# Patient Record
Sex: Male | Born: 1955
Health system: Southern US, Community
[De-identification: ages and names within clinical notes are randomized; demographics above are authoritative.]

## PROBLEM LIST (undated history)

## (undated) DIAGNOSIS — E785 Hyperlipidemia, unspecified: Secondary | ICD-10-CM

## (undated) DIAGNOSIS — J302 Other seasonal allergic rhinitis: Secondary | ICD-10-CM

## (undated) DIAGNOSIS — T7840XA Allergy, unspecified, initial encounter: Secondary | ICD-10-CM

## (undated) DIAGNOSIS — I10 Essential (primary) hypertension: Secondary | ICD-10-CM

## (undated) HISTORY — PX: BACK SURGERY: SHX140

## (undated) HISTORY — DX: Essential (primary) hypertension: I10

## (undated) HISTORY — DX: Hyperlipidemia, unspecified: E78.5

## (undated) HISTORY — DX: Other seasonal allergic rhinitis: J30.2

## (undated) HISTORY — DX: Allergy, unspecified, initial encounter: T78.40XA

---

## 2003-09-18 ENCOUNTER — Encounter: Payer: Self-pay | Admitting: Internal Medicine

## 2005-02-16 HISTORY — PX: POLYPECTOMY: SHX149

## 2005-02-16 HISTORY — PX: COLONOSCOPY: SHX174

## 2005-04-21 ENCOUNTER — Ambulatory Visit: Payer: Self-pay | Admitting: Internal Medicine

## 2005-04-22 ENCOUNTER — Encounter: Admission: RE | Admit: 2005-04-22 | Discharge: 2005-04-22 | Payer: Self-pay | Admitting: Internal Medicine

## 2005-05-11 ENCOUNTER — Ambulatory Visit: Payer: Self-pay | Admitting: Internal Medicine

## 2005-05-20 ENCOUNTER — Ambulatory Visit: Payer: Self-pay | Admitting: Internal Medicine

## 2005-06-17 ENCOUNTER — Ambulatory Visit: Payer: Self-pay | Admitting: Gastroenterology

## 2005-07-06 ENCOUNTER — Ambulatory Visit: Payer: Self-pay | Admitting: Gastroenterology

## 2005-07-06 ENCOUNTER — Encounter (INDEPENDENT_AMBULATORY_CARE_PROVIDER_SITE_OTHER): Payer: Self-pay | Admitting: *Deleted

## 2005-07-06 LAB — HM COLONOSCOPY

## 2005-08-17 ENCOUNTER — Ambulatory Visit: Payer: Self-pay | Admitting: Internal Medicine

## 2005-08-31 ENCOUNTER — Ambulatory Visit: Payer: Self-pay | Admitting: Internal Medicine

## 2005-10-28 ENCOUNTER — Ambulatory Visit: Payer: Self-pay | Admitting: Internal Medicine

## 2006-03-11 ENCOUNTER — Ambulatory Visit: Payer: Self-pay | Admitting: Internal Medicine

## 2006-04-19 ENCOUNTER — Ambulatory Visit: Payer: Self-pay | Admitting: Internal Medicine

## 2006-04-19 LAB — CONVERTED CEMR LAB
AST: 22 units/L (ref 0–37)
Basophils Relative: 1.2 % — ABNORMAL HIGH (ref 0.0–1.0)
Bilirubin, Direct: 0.1 mg/dL (ref 0.0–0.3)
CO2: 30 meq/L (ref 19–32)
Calcium: 9.4 mg/dL (ref 8.4–10.5)
Cholesterol: 207 mg/dL (ref 0–200)
Direct LDL: 131.4 mg/dL
Eosinophils Absolute: 0.4 10*3/uL (ref 0.0–0.6)
Eosinophils Relative: 6.3 % — ABNORMAL HIGH (ref 0.0–5.0)
GFR calc Af Amer: 63 mL/min
HCT: 42.2 % (ref 39.0–52.0)
HDL: 46.2 mg/dL (ref >39.0)
MCHC: 35.2 g/dL (ref 30.0–36.0)
Monocytes Relative: 7.3 % (ref 3.0–11.0)
PSA: 0.59 ng/mL (ref 0.10–4.00)
RBC: 4.82 M/uL (ref 4.22–5.81)
Sodium: 144 meq/L (ref 135–145)
Total Bilirubin: 1 mg/dL (ref 0.3–1.2)
Total CHOL/HDL Ratio: 4.5
VLDL: 31 mg/dL (ref 0–40)

## 2006-04-26 ENCOUNTER — Ambulatory Visit: Payer: Self-pay | Admitting: Internal Medicine

## 2006-05-17 ENCOUNTER — Ambulatory Visit: Payer: Self-pay | Admitting: Internal Medicine

## 2006-05-25 ENCOUNTER — Encounter: Admission: RE | Admit: 2006-05-25 | Discharge: 2006-05-25 | Payer: Self-pay | Admitting: Anesthesiology

## 2006-05-31 ENCOUNTER — Ambulatory Visit (HOSPITAL_COMMUNITY): Admission: RE | Admit: 2006-05-31 | Discharge: 2006-06-01 | Payer: Self-pay | Admitting: Neurological Surgery

## 2007-11-28 ENCOUNTER — Ambulatory Visit: Payer: Self-pay | Admitting: Internal Medicine

## 2007-11-28 ENCOUNTER — Telehealth: Payer: Self-pay | Admitting: Internal Medicine

## 2007-12-13 ENCOUNTER — Ambulatory Visit: Payer: Self-pay | Admitting: Internal Medicine

## 2007-12-13 LAB — CONVERTED CEMR LAB
AST: 27 units/L (ref 0–37)
Albumin: 4 g/dL (ref 3.5–5.2)
Alkaline Phosphatase: 42 units/L (ref 39–117)
Basophils Absolute: 0 10*3/uL (ref 0.0–0.1)
Basophils Relative: 0.6 % (ref 0.0–3.0)
Bilirubin Urine: NEGATIVE
Calcium: 9.3 mg/dL (ref 8.4–10.5)
Chloride: 106 meq/L (ref 96–112)
Cholesterol: 172 mg/dL (ref 0–200)
Eosinophils Absolute: 0.2 10*3/uL (ref 0.0–0.7)
Eosinophils Relative: 3.7 % (ref 0.0–5.0)
GFR calc non Af Amer: 75 mL/min
Glucose, Bld: 92 mg/dL (ref 70–99)
HDL: 41.5 mg/dL (ref >39.0)
LDL Cholesterol: 110 mg/dL — ABNORMAL HIGH (ref 0–99)
Lymphocytes Relative: 33.8 % (ref 12.0–46.0)
Potassium: 5 meq/L (ref 3.5–5.1)
Protein, U semiquant: NEGATIVE
Sodium: 144 meq/L (ref 135–145)
Specific Gravity, Urine: 1.015
TSH: 0.9 microintl units/mL (ref 0.35–5.50)
Triglycerides: 102 mg/dL (ref 0–149)

## 2007-12-20 ENCOUNTER — Ambulatory Visit: Payer: Self-pay | Admitting: Internal Medicine

## 2007-12-20 DIAGNOSIS — E785 Hyperlipidemia, unspecified: Secondary | ICD-10-CM

## 2008-09-25 ENCOUNTER — Telehealth (INDEPENDENT_AMBULATORY_CARE_PROVIDER_SITE_OTHER): Payer: Self-pay | Admitting: *Deleted

## 2009-06-12 ENCOUNTER — Ambulatory Visit: Payer: Self-pay | Admitting: Internal Medicine

## 2009-06-12 LAB — CONVERTED CEMR LAB
ALT: 49 units/L (ref 0–53)
Albumin: 4.3 g/dL (ref 3.5–5.2)
Alkaline Phosphatase: 39 units/L (ref 39–117)
BUN: 17 mg/dL (ref 6–23)
Bilirubin Urine: NEGATIVE
Bilirubin, Direct: 0.1 mg/dL (ref 0.0–0.3)
CO2: 30 meq/L (ref 19–32)
Cholesterol: 187 mg/dL (ref 0–200)
Eosinophils Absolute: 0.3 10*3/uL (ref 0.0–0.7)
GFR calc non Af Amer: 82.69 mL/min (ref >60)
Glucose, Bld: 97 mg/dL (ref 70–99)
HCT: 42 % (ref 39.0–52.0)
HDL: 52.2 mg/dL (ref >39.00)
Hemoglobin: 14.8 g/dL (ref 13.0–17.0)
MCHC: 35.3 g/dL (ref 30.0–36.0)
MCV: 89.4 fL (ref 78.0–100.0)
Monocytes Absolute: 0.3 10*3/uL (ref 0.1–1.0)
Monocytes Relative: 5.5 % (ref 3.0–12.0)
Potassium: 4.5 meq/L (ref 3.5–5.1)
RBC: 4.69 M/uL (ref 4.22–5.81)
RDW: 13.1 % (ref 11.5–14.6)
Total CHOL/HDL Ratio: 4
Total Protein: 6.9 g/dL (ref 6.0–8.3)
Urobilinogen, UA: 0.2
VLDL: 25.8 mg/dL (ref 0.0–40.0)

## 2009-06-21 ENCOUNTER — Ambulatory Visit: Payer: Self-pay | Admitting: Internal Medicine

## 2009-08-05 ENCOUNTER — Telehealth: Payer: Self-pay | Admitting: Internal Medicine

## 2010-03-19 NOTE — Progress Notes (Signed)
Summary: sinus infection  Phone Note Call from Patient   Caller: Patient Call For: Stacie Glaze MD Summary of Call: Pt is asking for Zpack for sinus infection called to [facility])  [phone] Initial call taken by: Lynann Beaver CMA,  August 05, 2009 3:15 PM  Follow-up for Phone Call        per dr Lovell Sheehan may have z pack Follow-up by: Willy Eddy, LPN,  August 05, 2009 3:58 PM  Additional Follow-up for Phone Call Additional follow up Details #1::        Pt. notified. Additional Follow-up by: Lynann Beaver CMA,  August 05, 2009 4:19 PM    New/Updated Medications: ZITHROMAX Z-PAK 250 MG TABS (AZITHROMYCIN) one by mouth daily Prescriptions: ZITHROMAX Z-PAK 250 MG TABS (AZITHROMYCIN) one by mouth daily  #6 x 0   Entered by:   Lynann Beaver CMA   Authorized by:   Stacie Glaze MD   Signed by:   Lynann Beaver CMA on 08/05/2009   Method used:   [facility] to        [facility]  [phone]* (retail)       [address] [place], [place]  [address]       Ph: [phone] or [phone]       Fax: [phone]   RxID:   [id number]

## 2010-03-19 NOTE — Assessment & Plan Note (Signed)
Summary: CPX//[facility]/PT RESCD FROM BUMP//CCM   Vital Signs:  Patient profile:   55 year old male Height:      72 inches Weight:      182 pounds BMI:     24.77 Temp:     98.3 degrees F oral Pulse rate:   56 / minute Resp:     14 per minute BP sitting:   130 / 80  (left arm)  Vitals Entered By: Willy Eddy, LPN (Jun 21, 1476 1:57 PM)  Contraindications/Deferment of Procedures/Staging:    Test/Procedure: FLU VAX    Reason for deferment: patient declined  CC: cpx, Lipid Management   CC:  cpx and Lipid Management.  History of Present Illness: The pt was asked about all immunizations, health maint. services that are appropriate to their age and was given guidance on diet exercize  and weight management   Lipid Management History:      Positive NCEP/ATP III risk factors include male age 21 years old or older.  Negative NCEP/ATP III risk factors include non-tobacco-user status, non-hypertensive, no ASHD (atherosclerotic heart disease), no prior stroke/TIA, and no peripheral vascular disease.     Preventive Screening-Counseling & Management  Alcohol-Tobacco     Smoking Status: never  Problems Prior to Update: 1)  Preventive Health Care  (ICD-V70.0) 2)  Hyperlipidemia, Controlled  (ICD-272.4) 3)  Acute Maxillary Sinusitis  (ICD-461.0)  Current Problems (verified): 1)  Preventive Health Care  (ICD-V70.0) 2)  Hyperlipidemia, Controlled  (ICD-272.4) 3)  Acute Maxillary Sinusitis  (ICD-461.0)  Medications Prior to Update: 1)  Valtrex 500 Mg Tabs (Valacyclovir Hcl) .... One By Mouth Dailym As Needed 2)  Lipitor 10 Mg Tabs (Atorvastatin Calcium) .Marland Kitchen.. 1 Once Daily 3)  Aspirin Adult Low Strength 81 Mg Tbec (Aspirin) .... One By Mouth Daily 4)  Fish Oil Concentrate 1000 Mg Caps (Omega-3 Fatty Acids) .... Two Times A Day 5)  Zithromax Z-Pak 250 Mg Tabs (Azithromycin) .... As Directed.  Current Medications (verified): 1)  Valtrex 500 Mg Tabs (Valacyclovir Hcl) .... One By Mouth  Dailym As Needed 2)  Lipitor 10 Mg Tabs (Atorvastatin Calcium) .Marland Kitchen.. 1 Once Daily  Allergies (verified): No Known Drug Allergies  Past History:  Past medical, surgical, family and social histories (including risk factors) reviewed, and no changes noted (except as noted below).  Family History: Reviewed history and no changes required.  Social History: Reviewed history from 11/28/2007 and no changes required. Married Never Smoked Alcohol use-yes Drug use-no Regular exercise-yes  Review of Systems  The patient denies anorexia, fever, weight loss, weight gain, vision loss, decreased hearing, hoarseness, chest pain, syncope, dyspnea on exertion, peripheral edema, prolonged cough, headaches, hemoptysis, abdominal pain, melena, hematochezia, severe indigestion/heartburn, hematuria, incontinence, genital sores, muscle weakness, suspicious skin lesions, transient blindness, difficulty walking, depression, unusual weight change, abnormal bleeding, enlarged lymph nodes, angioedema, and breast masses.    Physical Exam  General:  Well-developed,well-nourished,in no acute distress; alert,appropriate and cooperative throughout examination Head:  Normocephalic and atraumatic without obvious abnormalities. No apparent alopecia or balding. Eyes:  No corneal or conjunctival inflammation noted. EOMI. Perrla. Funduscopic exam benign, without hemorrhages, exudates or papilledema. Vision grossly normal. Nose:  External nasal examination shows no deformity or inflammation. Nasal mucosa are pink and moist without lesions or exudates. Mouth:  Oral mucosa and oropharynx without lesions or exudates.  Teeth in good repair. Neck:  No deformities, masses, or tenderness noted. Lungs:  Normal respiratory effort, chest expands symmetrically. Lungs are clear to auscultation, no crackles  or wheezes. Heart:  Normal rate and regular rhythm. S1 and S2 normal without gallop, murmur, click, rub or other extra  sounds. Abdomen:  Bowel sounds positive,abdomen soft and non-tender without masses, organomegaly or hernias noted. Rectal:  No external abnormalities noted. Normal sphincter tone. No rectal masses or tenderness. Genitalia:  circumcised.   Prostate:  no gland enlargement and 1+ enlarged.   Msk:  normal ROM.   Extremities:  No clubbing, cyanosis, edema, or deformity noted with normal full range of motion of all joints.   Neurologic:  No cranial nerve deficits noted. Station and gait are normal. Plantar reflexes are down-going bilaterally. DTRs are symmetrical throughout. Sensory, motor and coordinative functions appear intact.   Complete Medication List: 1)  Valtrex 500 Mg Tabs (Valacyclovir hcl) .... One by mouth dailym as needed 2)  Lipitor 10 Mg Tabs (Atorvastatin calcium) .Marland Kitchen.. 1 once daily  Other Orders: EKG w/ Interpretation (93000)  Lipid Assessment/Plan:      Based on NCEP/ATP III, the patient's risk factor category is "0-1 risk factors".  The patient's lipid goals are as follows: Total cholesterol goal is 200; LDL cholesterol goal is 160; HDL cholesterol goal is 40; Triglyceride goal is 150.   Prescriptions: LIPITOR 10 MG TABS (ATORVASTATIN CALCIUM) 1 once daily  #31 x 11   Entered and Authorized by:   Stacie Glaze MD   Signed by:   Stacie Glaze MD on 06/21/2009   Method used:   [facility]ron[facility] to        [facility]  [phone]* (retail)       [address] [place], [place]  [address]       Ph: [phone] or [phone]       Fax: [phone]   RxID:   [id number]    Orders Added: 1)  EKG w/ Interpretation [93000] 2)  Est. Patient 40-64 years [18841]

## 2010-07-04 NOTE — Op Note (Signed)
NAME:  ALTARIQ, GOODALL NO.:  [id number]   MEDICAL RECORD NO.:  [id number]          PATIENT TYPE:  AMB   LOCATION:  [facility]                         FACILITY:  [facility]   PHYSICIAN:  Tia Alert, MD     DATE OF BIRTH:  May 06, 1955   DATE OF PROCEDURE:  05/31/2006  DATE OF DISCHARGE:                               OPERATIVE REPORT   PREOPERATIVE DIAGNOSIS:  Left L5-S1 herniated nucleus pulposus with left  S1 radiculopathy.   POSTOPERATIVE DIAGNOSIS:  Left L5-S1 herniated nucleus pulposus with  left S1 radiculopathy.   PROCEDURE:  Decompressive lumbar hemilaminectomy, medial facetectomy and  foraminotomy L5-S1 on the left followed by microdiskectomy L5-S1 on the  left utilizing microscopic dissection.   SURGEON:  Marikay Alar, M.D.   ANESTHESIA:  General endotracheal.   COMPLICATIONS:  None apparent.   INDICATIONS FOR PROCEDURE:  Mr. Rylander is a 55 year old gentleman who I  saw last year with a small left L5-S1 disk herniation.  He presented a  year later just several days ago with increasing pain in his back and  left leg.  He then had very severe left leg pain quite quickly.  MRI  showed a very large disk herniation with severe canal stenosis.  This  was much larger than previously and suggested a very large free fragment  with severe compression of the S1 nerve root.  I recommended a  microdiskectomy at L5-S1 on the left side.  He understood the risks,  benefits and expected outcome and wished to proceed.   DESCRIPTION OF PROCEDURE:  The patient was taken to the operating room  and after induction of adequate generalized endotracheal anesthesia he  was rolled in the prone position on the Wilson frame.  All pressure  points were padded.  His lumbar region was prepped with DuraPrep and  then draped in the usual sterile fashion.  Local anesthesia 5 cc was  injected.  A small dorsal midline incision was made and carried down to  the lumbosacral fascia.  The fascia  was opened on the left side and  taken down in a subperiosteal fashion to expose L5-S1 on the left.  Intraoperative x-ray confirmed my level.  The Kerrison punch and the  high-speed drill were used to perform a hemilaminectomy, medial  facetectomy and foraminotomy at L5-S1 on the left side.  The underlying  yellow ligament was opened and removed in a piecemeal fashion to expose  the underlying dura and S1 nerve root.  The S1 nerve root was very  erythematous and very swollen.  It was retracted over a very large disk  herniation that had a small film over it.  This was entered and then  removed with the nerve hook.  Several large fragments came out and the  nerve relaxed.  I then brought in the operating microscope, coagulated  the epidural venous vasculature, cut the disk space sharply and then  performed a thorough intradiskal diskectomy.  I used coronary dilator  and nerve hooks to try to bring down pieces from the midline.  I felt  into the foramen and into the  midline.  The annulus was sagging.  I had  a nice intradiskal diskectomy.  The nerve root was now free and  pulsatile.  I irrigated with saline solution, made sure I had adequate  decompression of the nerve root once again, lined the dura with Duragen  to prevent epidural scarring, used Depo-Medrol because of the redness of  the root and the swelling of the root.  I then removed the retractor,  inspected for any bleeding and then closed the fascia with #1 Vicryl  closing the subcutaneous and subcuticular tissue  with 2-0 and 3-0 Vicryl and closed the skin with Benzoin and Steri-  Strips.  The drapes were removed.  A sterile dressing was applied.  The  patient was awakened from general anesthesia and transferred to the  recovery room in stable condition.  At the end of the procedure, all  sponge, needle and sponge counts were correct.      Tia Alert, MD  Electronically Signed     DSJ/MEDQ  D:  05/31/2006  T:   06/01/2006  Job:  [id number]

## 2010-07-23 ENCOUNTER — Encounter: Payer: Self-pay | Admitting: Gastroenterology

## 2010-10-01 ENCOUNTER — Telehealth: Payer: Self-pay | Admitting: Internal Medicine

## 2010-10-01 NOTE — Telephone Encounter (Signed)
Pt has been called and was sch for cpx in Nov 7 at 2:45pm and labs on 10/26 at 8:15am. Pt has decided to see another doctor re: acute issue.

## 2010-10-01 NOTE — Telephone Encounter (Signed)
It looks lilke the first thing we will have is October 3rd and use 11;30 AND 11:45 SLOTS-THANKS

## 2010-10-01 NOTE — Telephone Encounter (Signed)
Pt has not been feeling well for 2 months and is req a work in cpx asap.  Pls advise.

## 2010-12-12 ENCOUNTER — Other Ambulatory Visit (INDEPENDENT_AMBULATORY_CARE_PROVIDER_SITE_OTHER): Payer: 59

## 2010-12-12 DIAGNOSIS — Z0000 Encounter for general adult medical examination without abnormal findings: Secondary | ICD-10-CM

## 2010-12-12 LAB — HEPATIC FUNCTION PANEL
ALT: 39 U/L (ref 0–53)
Albumin: 4.3 g/dL (ref 3.5–5.2)
Bilirubin, Direct: 0.1 mg/dL (ref 0.0–0.3)
Total Bilirubin: 0.7 mg/dL (ref 0.3–1.2)

## 2010-12-12 LAB — POCT URINALYSIS DIPSTICK
Bilirubin, UA: NEGATIVE
Ketones, UA: NEGATIVE
Nitrite, UA: NEGATIVE
pH, UA: 5.5

## 2010-12-12 LAB — CBC WITH DIFFERENTIAL/PLATELET
Basophils Relative: 0.9 % (ref 0.0–3.0)
Eosinophils Absolute: 0.3 10*3/uL (ref 0.0–0.7)
Eosinophils Relative: 5.8 % — ABNORMAL HIGH (ref 0.0–5.0)
HCT: 43.7 % (ref 39.0–52.0)
Lymphocytes Relative: 37.7 % (ref 12.0–46.0)
Lymphs Abs: 2 10*3/uL (ref 0.7–4.0)
MCHC: 34.7 g/dL (ref 30.0–36.0)
Monocytes Absolute: 0.4 10*3/uL (ref 0.1–1.0)
Monocytes Relative: 7.6 % (ref 3.0–12.0)
RBC: 4.88 Mil/uL (ref 4.22–5.81)
WBC: 5.4 10*3/uL (ref 4.5–10.5)

## 2010-12-12 LAB — BASIC METABOLIC PANEL
CO2: 29 mEq/L (ref 19–32)
GFR: 63.57 mL/min (ref >60.00)
Glucose, Bld: 100 mg/dL — ABNORMAL HIGH (ref 70–99)

## 2010-12-12 LAB — LIPID PANEL: VLDL: 22.6 mg/dL (ref 0.0–40.0)

## 2010-12-24 ENCOUNTER — Ambulatory Visit (INDEPENDENT_AMBULATORY_CARE_PROVIDER_SITE_OTHER): Payer: 59 | Admitting: Internal Medicine

## 2010-12-24 ENCOUNTER — Encounter: Payer: Self-pay | Admitting: Internal Medicine

## 2010-12-24 VITALS — BP 140/80 | HR 76 | Temp 98.2°F | Resp 16 | Ht 72.0 in | Wt 192.0 lb

## 2010-12-24 DIAGNOSIS — Z23 Encounter for immunization: Secondary | ICD-10-CM

## 2010-12-24 DIAGNOSIS — Z0000 Encounter for general adult medical examination without abnormal findings: Secondary | ICD-10-CM

## 2010-12-24 DIAGNOSIS — J45909 Unspecified asthma, uncomplicated: Secondary | ICD-10-CM

## 2010-12-24 NOTE — Progress Notes (Signed)
  Subjective:    Patient ID: Sean Richmond, male    DOB: [birth year], 55 y.o.   MRN: [id number]  HPIcpx   Symptoms of exercise induced asthma versus active airway disease from cold and allergens.  His story to the CBC with differential shows eosinophilia and he has a pattern of allergy and allergic rhinitis that would suggest that this is more reactive airway disease    Review of Systems  Constitutional: Negative for fever and fatigue.  HENT: Negative for hearing loss, congestion, neck pain and postnasal drip.   Eyes: Negative for discharge, redness and visual disturbance.  Respiratory: Negative for cough, shortness of breath and wheezing.   Cardiovascular: Negative for leg swelling.  Gastrointestinal: Negative for abdominal pain, constipation and abdominal distention.  Genitourinary: Negative for urgency and frequency.  Musculoskeletal: Negative for joint swelling and arthralgias.  Skin: Negative for color change and rash.  Neurological: Negative for weakness and light-headedness.  Hematological: Negative for adenopathy.  Psychiatric/Behavioral: Negative for behavioral problems.   Past Medical History  Diagnosis Date  . Hyperlipidemia    No past surgical history on file.  reports that he has never smoked. He does not have any smokeless tobacco history on file. He reports that he drinks alcohol. He reports that he does not use illicit drugs. family history is not on file. No Known Allergies      Objective:   Physical Exam  Nursing note and vitals reviewed. Constitutional: He is oriented to person, place, and time. He appears well-developed and well-nourished.  HENT:  Head: Normocephalic and atraumatic.  Eyes: Conjunctivae are normal. Pupils are equal, round, and reactive to light.  Neck: Normal range of motion. Neck supple.  Cardiovascular: Normal rate and regular rhythm.   Pulmonary/Chest: Effort normal and breath sounds normal.  Abdominal: Soft. Bowel sounds are  normal.  Musculoskeletal: Normal range of motion.  Neurological: He is alert and oriented to person, place, and time.  Skin: Skin is warm and dry.          Assessment & Plan:   Patient presents for yearly preventative medicine examination.   all immunizations and health maintenance protocols were reviewed with the patient and they are up to date with these protocols.   screening laboratory values were reviewed with the patient including screening of hyperlipidemia PSA renal function and hepatic function.   There medications past medical history social history problem list and allergies were reviewed in detail.   Goals were established with regard to weight loss exercise diet in compliance with medications  New diagnosis reactive airway disease trial of Pulmicort Flexeril her daily for 2 months in followup  Treatment for allergic asthmatic condition discussed with referral to allergist

## 2010-12-24 NOTE — Patient Instructions (Signed)
PLAN:  The patient is instructed to continue all medications as prescribed. Schedule followup with check out clerk upon leaving the clinic

## 2010-12-24 NOTE — Assessment & Plan Note (Signed)
Trial of inhaled corticosteroid for reactive airway disease he'll be given samples of Asmanex pot use and will followup in 2 months time to see if there is a pattern stabilization

## 2011-03-06 ENCOUNTER — Ambulatory Visit: Payer: 59 | Admitting: Internal Medicine

## 2013-03-10 ENCOUNTER — Other Ambulatory Visit: Payer: 59

## 2013-03-11 ENCOUNTER — Telehealth: Payer: Self-pay | Admitting: Internal Medicine

## 2013-03-11 NOTE — Telephone Encounter (Signed)
Patient came to [facility] office on Saturday morning to have his labs done for his cpe appt on 03-20-2013.  He will be out of town till 03-19-2013 and there is not another day that he can come for labs.  We do not do labs at [facility] on Saturdays.  He will come to [place] on 03-20-2013 in the am to have his labs done and then back at 2 for his cpe with Dr Arnoldo Morale.  He is aware that the results may or may be be available at that  time

## 2013-03-13 ENCOUNTER — Other Ambulatory Visit: Payer: Self-pay | Admitting: *Deleted

## 2013-03-13 ENCOUNTER — Other Ambulatory Visit: Payer: 59

## 2013-03-13 DIAGNOSIS — Z0000 Encounter for general adult medical examination without abnormal findings: Secondary | ICD-10-CM

## 2013-03-13 NOTE — Telephone Encounter (Signed)
done

## 2013-03-20 ENCOUNTER — Ambulatory Visit (INDEPENDENT_AMBULATORY_CARE_PROVIDER_SITE_OTHER): Payer: BC Managed Care – PPO | Admitting: Internal Medicine

## 2013-03-20 ENCOUNTER — Encounter: Payer: Self-pay | Admitting: Internal Medicine

## 2013-03-20 ENCOUNTER — Other Ambulatory Visit (INDEPENDENT_AMBULATORY_CARE_PROVIDER_SITE_OTHER): Payer: BC Managed Care – PPO

## 2013-03-20 VITALS — BP 140/90 | HR 76 | Temp 98.2°F | Resp 16 | Ht 72.0 in | Wt 196.0 lb

## 2013-03-20 DIAGNOSIS — E785 Hyperlipidemia, unspecified: Secondary | ICD-10-CM

## 2013-03-20 DIAGNOSIS — Z0000 Encounter for general adult medical examination without abnormal findings: Secondary | ICD-10-CM

## 2013-03-20 LAB — LIPID PANEL
CHOL/HDL RATIO: 5
CHOLESTEROL: 243 mg/dL — AB (ref 0–200)
HDL: 44.6 mg/dL (ref >39.00)
TRIGLYCERIDES: 221 mg/dL — AB (ref 0.0–149.0)
VLDL: 44.2 mg/dL — ABNORMAL HIGH (ref 0.0–40.0)

## 2013-03-20 LAB — CBC WITH DIFFERENTIAL/PLATELET
Basophils Absolute: 0 10*3/uL (ref 0.0–0.1)
Basophils Relative: 0.7 % (ref 0.0–3.0)
Eosinophils Absolute: 0.2 10*3/uL (ref 0.0–0.7)
Eosinophils Relative: 3.4 % (ref 0.0–5.0)
HCT: 42.8 % (ref 39.0–52.0)
HEMOGLOBIN: 14.6 g/dL (ref 13.0–17.0)
Lymphocytes Relative: 42.7 % (ref 12.0–46.0)
Lymphs Abs: 2.5 10*3/uL (ref 0.7–4.0)
MCHC: 34 g/dL (ref 30.0–36.0)
MCV: 89.4 fl (ref 78.0–100.0)
MONO ABS: 0.5 10*3/uL (ref 0.1–1.0)
MONOS PCT: 8.7 % (ref 3.0–12.0)
NEUTROS PCT: 44.5 % (ref 43.0–77.0)
Neutro Abs: 2.6 10*3/uL (ref 1.4–7.7)
PLATELETS: 282 10*3/uL (ref 150.0–400.0)
RBC: 4.78 Mil/uL (ref 4.22–5.81)
RDW: 12.8 % (ref 11.5–14.6)
WBC: 5.7 10*3/uL (ref 4.5–10.5)

## 2013-03-20 LAB — BASIC METABOLIC PANEL
BUN: 17 mg/dL (ref 6–23)
CO2: 29 mEq/L (ref 19–32)
CREATININE: 1 mg/dL (ref 0.4–1.5)
Calcium: 9.1 mg/dL (ref 8.4–10.5)
Chloride: 105 mEq/L (ref 96–112)
GFR: 77.96 mL/min (ref >60.00)
GLUCOSE: 95 mg/dL (ref 70–99)
Potassium: 5.2 mEq/L — ABNORMAL HIGH (ref 3.5–5.1)
SODIUM: 141 meq/L (ref 135–145)

## 2013-03-20 LAB — LDL CHOLESTEROL, DIRECT: LDL DIRECT: 150.9 mg/dL

## 2013-03-20 LAB — TSH: TSH: 1.08 u[IU]/mL (ref 0.35–5.50)

## 2013-03-20 LAB — HEPATIC FUNCTION PANEL
ALK PHOS: 39 U/L (ref 39–117)
ALT: 32 U/L (ref 0–53)
AST: 26 U/L (ref 0–37)
Albumin: 4 g/dL (ref 3.5–5.2)
BILIRUBIN DIRECT: 0 mg/dL (ref 0.0–0.3)
BILIRUBIN TOTAL: 0.6 mg/dL (ref 0.3–1.2)
Total Protein: 6.4 g/dL (ref 6.0–8.3)

## 2013-03-20 LAB — PSA: PSA: 0.75 ng/mL (ref 0.10–4.00)

## 2013-03-20 MED ORDER — AZELASTINE-FLUTICASONE 137-50 MCG/ACT NA SUSP: 1.0000 | Freq: Two times a day (BID) | NASAL | Status: DC

## 2013-03-20 MED ORDER — ATORVASTATIN CALCIUM 20 MG PO TABS: 20.0000 mg | ORAL_TABLET | ORAL | Status: DC

## 2013-03-20 NOTE — Progress Notes (Signed)
Pre visit review using our clinic review tool, if applicable. No additional management support is needed unless otherwise documented below in the visit note. 

## 2013-03-20 NOTE — Progress Notes (Signed)
   Subjective:    Patient ID: Sean Richmond, male    DOB: [birth year], 58 y.o.   MRN: [id number]  HPICPX wellness last exam 2012  Follow up for hyperlipidemia CPX   Off lipitor Not on aspirin      Review of Systems  Constitutional: Negative for fever and fatigue.  HENT: Negative for congestion, hearing loss and postnasal drip.   Eyes: Negative for discharge, redness and visual disturbance.  Respiratory: Negative for cough, shortness of breath and wheezing.   Cardiovascular: Negative for leg swelling.  Gastrointestinal: Negative for abdominal pain, constipation and abdominal distention.  Genitourinary: Negative for urgency and frequency.  Musculoskeletal: Negative for arthralgias, joint swelling and neck pain.  Skin: Negative for color change and rash.  Neurological: Negative for weakness and light-headedness.  Hematological: Negative for adenopathy.  Psychiatric/Behavioral: Negative for behavioral problems.   Past Medical History  Diagnosis Date  . Hyperlipidemia     History   Social History  . Marital Status: Single    Spouse Name: N/A    Number of Children: N/A  . Years of Education: N/A   Occupational History  . Not on file.   Social History Main Topics  . Smoking status: Never Smoker   . Smokeless tobacco: Not on file  . Alcohol Use: Yes  . Drug Use: No  . Sexual Activity:    Other Topics Concern  . Not on file   Social History Narrative  . No narrative on file    History reviewed. No pertinent past surgical history.  No family history on file.  No Known Allergies  Current Outpatient Prescriptions on File Prior to Visit  Medication Sig Dispense Refill  . loratadine (CLARITIN) 10 MG tablet Take 10 mg by mouth daily.         No current facility-administered medications on file prior to visit.    BP 140/90  Pulse 76  Temp(Src) 98.2 F (36.8 C)  Resp 16  Ht 6' (1.829 m)  Wt 196 lb (88.905 kg)  BMI 26.58 kg/m2       Objective:   Physical Exam  Nursing note and vitals reviewed. Constitutional: He is oriented to person, place, and time. He appears well-developed and well-nourished.  HENT:  Head: Normocephalic and atraumatic.  Eyes: Conjunctivae are normal. Pupils are equal, round, and reactive to light.  Neck: Normal range of motion. Neck supple.  Cardiovascular: Normal rate and regular rhythm.   Pulmonary/Chest: Effort normal and breath sounds normal.  Abdominal: Soft. Bowel sounds are normal.  Genitourinary: Rectum normal and prostate normal.  Musculoskeletal: Normal range of motion.  Neurological: He is alert and oriented to person, place, and time.  Skin: Skin is warm and dry.  Psychiatric: He has a normal mood and affect. His behavior is normal.          Assessment & Plan:   Patient presents for yearly preventative medicine examination.   all immunizations and health maintenance protocols were reviewed with the patient and they are up to date with these protocols.   screening laboratory values were reviewed with the patient including screening of hyperlipidemia PSA renal function and hepatic function.   There medications past medical history social history problem list and allergies were reviewed in detail.   Goals were established with regard to weight loss exercise diet in compliance with medications

## 2013-06-20 ENCOUNTER — Other Ambulatory Visit: Payer: BC Managed Care – PPO

## 2013-06-22 ENCOUNTER — Other Ambulatory Visit: Payer: Self-pay | Admitting: *Deleted

## 2013-06-22 DIAGNOSIS — E785 Hyperlipidemia, unspecified: Secondary | ICD-10-CM

## 2013-06-23 ENCOUNTER — Other Ambulatory Visit (INDEPENDENT_AMBULATORY_CARE_PROVIDER_SITE_OTHER): Payer: BC Managed Care – PPO

## 2013-06-23 DIAGNOSIS — E785 Hyperlipidemia, unspecified: Secondary | ICD-10-CM

## 2013-06-23 LAB — LIPID PANEL
Cholesterol: 254 mg/dL — ABNORMAL HIGH (ref 0–200)
HDL: 44.1 mg/dL (ref >39.00)
LDL CALC: 174 mg/dL — AB (ref 0–99)
Total CHOL/HDL Ratio: 6
Triglycerides: 178 mg/dL — ABNORMAL HIGH (ref 0.0–149.0)
VLDL: 35.6 mg/dL (ref 0.0–40.0)

## 2014-03-14 ENCOUNTER — Other Ambulatory Visit: Payer: BC Managed Care – PPO

## 2014-03-21 ENCOUNTER — Encounter: Payer: BC Managed Care – PPO | Admitting: Internal Medicine

## 2015-05-06 ENCOUNTER — Encounter: Payer: Self-pay | Admitting: Internal Medicine

## 2015-08-23 ENCOUNTER — Encounter: Payer: Self-pay | Admitting: Internal Medicine

## 2016-02-20 DIAGNOSIS — Z1389 Encounter for screening for other disorder: Secondary | ICD-10-CM | POA: Diagnosis not present

## 2016-02-20 DIAGNOSIS — H6690 Otitis media, unspecified, unspecified ear: Secondary | ICD-10-CM | POA: Diagnosis not present

## 2016-02-20 DIAGNOSIS — J029 Acute pharyngitis, unspecified: Secondary | ICD-10-CM | POA: Diagnosis not present

## 2016-02-20 DIAGNOSIS — I10 Essential (primary) hypertension: Secondary | ICD-10-CM | POA: Diagnosis not present

## 2016-07-27 DIAGNOSIS — Z0000 Encounter for general adult medical examination without abnormal findings: Secondary | ICD-10-CM | POA: Diagnosis not present

## 2016-08-03 DIAGNOSIS — M79673 Pain in unspecified foot: Secondary | ICD-10-CM | POA: Diagnosis not present

## 2016-08-03 DIAGNOSIS — M545 Low back pain: Secondary | ICD-10-CM | POA: Diagnosis not present

## 2016-08-03 DIAGNOSIS — Z0000 Encounter for general adult medical examination without abnormal findings: Secondary | ICD-10-CM | POA: Diagnosis not present

## 2016-08-03 DIAGNOSIS — I10 Essential (primary) hypertension: Secondary | ICD-10-CM | POA: Diagnosis not present

## 2016-08-04 DIAGNOSIS — M79672 Pain in left foot: Secondary | ICD-10-CM | POA: Diagnosis not present

## 2016-08-04 DIAGNOSIS — M79671 Pain in right foot: Secondary | ICD-10-CM | POA: Diagnosis not present

## 2017-04-03 DIAGNOSIS — Z23 Encounter for immunization: Secondary | ICD-10-CM | POA: Diagnosis not present

## 2017-08-12 DIAGNOSIS — R82998 Other abnormal findings in urine: Secondary | ICD-10-CM | POA: Diagnosis not present

## 2017-08-12 DIAGNOSIS — Z0000 Encounter for general adult medical examination without abnormal findings: Secondary | ICD-10-CM | POA: Diagnosis not present

## 2017-08-26 DIAGNOSIS — M545 Low back pain: Secondary | ICD-10-CM | POA: Diagnosis not present

## 2017-08-26 DIAGNOSIS — Z1389 Encounter for screening for other disorder: Secondary | ICD-10-CM | POA: Diagnosis not present

## 2017-08-26 DIAGNOSIS — B356 Tinea cruris: Secondary | ICD-10-CM | POA: Diagnosis not present

## 2017-08-26 DIAGNOSIS — Z0000 Encounter for general adult medical examination without abnormal findings: Secondary | ICD-10-CM | POA: Diagnosis not present

## 2017-08-26 DIAGNOSIS — I10 Essential (primary) hypertension: Secondary | ICD-10-CM | POA: Diagnosis not present

## 2017-08-27 DIAGNOSIS — Z1212 Encounter for screening for malignant neoplasm of rectum: Secondary | ICD-10-CM | POA: Diagnosis not present

## 2017-11-02 DIAGNOSIS — H18053 Posterior corneal pigmentations, bilateral: Secondary | ICD-10-CM | POA: Diagnosis not present

## 2017-11-02 DIAGNOSIS — H35371 Puckering of macula, right eye: Secondary | ICD-10-CM | POA: Diagnosis not present

## 2017-11-02 DIAGNOSIS — H2513 Age-related nuclear cataract, bilateral: Secondary | ICD-10-CM | POA: Diagnosis not present

## 2017-12-28 DIAGNOSIS — H6121 Impacted cerumen, right ear: Secondary | ICD-10-CM | POA: Diagnosis not present

## 2017-12-28 DIAGNOSIS — I10 Essential (primary) hypertension: Secondary | ICD-10-CM | POA: Diagnosis not present

## 2017-12-28 DIAGNOSIS — R05 Cough: Secondary | ICD-10-CM | POA: Diagnosis not present

## 2018-07-27 ENCOUNTER — Encounter: Payer: Self-pay | Admitting: Cardiology

## 2018-07-27 ENCOUNTER — Ambulatory Visit: Payer: 59 | Admitting: Cardiology

## 2018-07-27 ENCOUNTER — Other Ambulatory Visit: Payer: Self-pay

## 2018-07-27 VITALS — BP 141/74 | HR 64 | Temp 97.8°F | Ht 70.5 in | Wt 196.4 lb

## 2018-07-27 DIAGNOSIS — I10 Essential (primary) hypertension: Secondary | ICD-10-CM

## 2018-07-27 DIAGNOSIS — R0602 Shortness of breath: Secondary | ICD-10-CM

## 2018-07-27 DIAGNOSIS — Z8249 Family history of ischemic heart disease and other diseases of the circulatory system: Secondary | ICD-10-CM | POA: Diagnosis not present

## 2018-07-27 DIAGNOSIS — R0789 Other chest pain: Secondary | ICD-10-CM

## 2018-07-27 DIAGNOSIS — E7800 Pure hypercholesterolemia, unspecified: Secondary | ICD-10-CM | POA: Diagnosis not present

## 2018-07-27 DIAGNOSIS — R9431 Abnormal electrocardiogram [ECG] [EKG]: Secondary | ICD-10-CM

## 2018-07-27 NOTE — Progress Notes (Signed)
Primary Physician/Referring:  Leanna Battles, MD  Patient ID: Sean Richmond, male    DOB: 1955-06-03, 63 y.o.   MRN: [id number]  Chief Complaint  Patient presents with   New Patient (Initial Visit)   Shortness of Breath    HPI: Sean Richmond  is a 63 y.o. male  with Hypertension, hyperlipidemia, strong family history of premature coronary artery disease in his father with MI at age 69 and sister with carotid endarterectomy at age 75, referred to me on a stat basis for evaluation of recent onset of dyspnea on exertion.  Patient is an avid cyclist and over the past 2 weeks he has noticed decreased exercise tolerance and marked dyspnea to point where he had to stop and had to return home.  He has occasional noticed associated chest tightness as well but no radiation.  No PND or orthopnea.  No hemoptysis or leg edema.  No recent injury to his leg.  Past Medical History:  Diagnosis Date   Hyperlipidemia    Hypertension    Seasonal allergies     Past Surgical History:  Procedure Laterality Date   BACK SURGERY  19    Social History   Socioeconomic History   Marital status: Married    Spouse name: Not on file   Number of children: 1   Years of education: Not on file   Highest education level: Not on file  Occupational History   Not on file  Social Needs   Financial resource strain: Not on file   Food insecurity:    Worry: Not on file    Inability: Not on file   Transportation needs:    Medical: Not on file    Non-medical: Not on file  Tobacco Use   Smoking status: Never Smoker   Smokeless tobacco: Never Used  Substance and Sexual Activity   Alcohol use: Yes    Comment: beer occ   Drug use: No   Sexual activity: Not on file  Lifestyle   Physical activity:    Days per week: Not on file    Minutes per session: Not on file   Stress: Not on file  Relationships   Social connections:    Talks on phone: Not on file    Gets together: Not on  file    Attends religious service: Not on file    Active member of club or organization: Not on file    Attends meetings of clubs or organizations: Not on file    Relationship status: Not on file   Intimate partner violence:    Fear of current or ex partner: Not on file    Emotionally abused: Not on file    Physically abused: Not on file    Forced sexual activity: Not on file  Other Topics Concern   Not on file  Social History Narrative   Not on file   Review of Systems  Constitution: Negative for chills, decreased appetite, malaise/fatigue and weight gain.  Cardiovascular: Positive for chest pain and dyspnea on exertion. Negative for leg swelling and syncope.  Endocrine: Negative for cold intolerance.  Hematologic/Lymphatic: Does not bruise/bleed easily.  Musculoskeletal: Negative for joint swelling.  Gastrointestinal: Negative for abdominal pain, anorexia, change in bowel habit, hematochezia and melena.  Neurological: Negative for headaches and light-headedness.  Psychiatric/Behavioral: Negative for depression and substance abuse.  All other systems reviewed and are negative.     Objective  Blood pressure (!) 141/74, pulse 64, temperature 97.8 F (36.6  C), height 5' 10.5" (1.791 m), weight 196 lb 6.4 oz (89.1 kg), SpO2 97 %. Body mass index is 27.78 kg/m.    Physical Exam  Constitutional: He appears well-developed and well-nourished. No distress.  HENT:  Head: Atraumatic.  Eyes: Conjunctivae are normal.  Neck: Neck supple. No JVD present. No thyromegaly present.  Cardiovascular: Normal rate, regular rhythm, normal heart sounds and intact distal pulses. Exam reveals no gallop.  No murmur heard. Pulmonary/Chest: Effort normal and breath sounds normal.  Abdominal: Soft. Bowel sounds are normal.  Musculoskeletal: Normal range of motion.  Neurological: He is alert.  Skin: Skin is warm and dry.  Psychiatric: He has a normal mood and affect.   Radiology: No results  found.  Laboratory examination:   Labs 07/22/2018: Hb 15.2/HCT 25.0, platelets 278.  CMP Latest Ref Rng & Units 03/20/2013 12/12/2010 06/12/2009  Glucose 70 - 99 mg/dL 95 100(H) 97  BUN 6 - 23 mg/dL 17 17 17   Creatinine 0.4 - 1.5 mg/dL 1.0 1.3 1.0  Sodium 135 - 145 mEq/L 141 142 140  Potassium 3.5 - 5.1 mEq/L 5.2(H) 5.0 4.5  Chloride 96 - 112 mEq/L 105 105 102  CO2 19 - 32 mEq/L 29 29 30   Calcium 8.4 - 10.5 mg/dL 9.1 9.6 9.3  Total Protein 6.0 - 8.3 g/dL 6.4 6.8 6.9  Total Bilirubin 0.3 - 1.2 mg/dL 0.6 0.7 1.1  Alkaline Phos 39 - 117 U/L 39 39 39  AST 0 - 37 U/L 26 24 29   ALT 0 - 53 U/L 32 39 49   CBC Latest Ref Rng & Units 03/20/2013 12/12/2010 06/12/2009  WBC 4.5 - 10.5 K/uL 5.7 5.4 5.4  Hemoglobin 13.0 - 17.0 g/dL 14.6 15.2 14.8  Hematocrit 39.0 - 52.0 % 42.8 43.7 42.0  Platelets 150.0 - 400.0 K/uL 282.0 289.0 272.0   Lipid Panel     Component Value Date/Time   CHOL 254 (H) 06/23/2013 1010   TRIG 178.0 (H) 06/23/2013 1010   HDL 44.10 06/23/2013 1010   CHOLHDL 6 06/23/2013 1010   VLDL 35.6 06/23/2013 1010   LDLCALC 174 (H) 06/23/2013 1010   LDLDIRECT 150.9 03/20/2013 0739   HEMOGLOBIN A1C No results found for: HGBA1C, MPG TSH No results for input(s): TSH in the last 8760 hours.  PRN Meds:. Medications Discontinued During This Encounter  Medication Reason   atorvastatin (LIPITOR) 20 MG tablet Change in therapy   Azelastine-Fluticasone 137-50 MCG/ACT SUSP Completed Course   Current Meds  Medication Sig   amLODipine (NORVASC) 5 MG tablet daily.   aspirin EC 81 MG tablet Take 81 mg by mouth daily.   loratadine (CLARITIN) 10 MG tablet Take 10 mg by mouth as needed.    pravastatin (PRAVACHOL) 40 MG tablet at bedtime.   telmisartan (MICARDIS) 40 MG tablet daily.   [DISCONTINUED] atorvastatin (LIPITOR) 20 MG tablet Take 1 tablet (20 mg total) by mouth 2 (two) times a week.    Cardiac Studies:   None  Assessment   Shortness of breath - Plan: EKG 12-Lead,  PCV ECHOCARDIOGRAM COMPLETE, CT CORONARY MORPH W/CTA COR W/SCORE W/CA W/CM &/OR WO/CM, CT CORONARY FRACTIONAL FLOW RESERVE DATA PREP, CT CORONARY FRACTIONAL FLOW RESERVE FLUID ANALYSIS  Chest tightness - Plan: CT CORONARY MORPH W/CTA COR W/SCORE W/CA W/CM &/OR WO/CM, CT CORONARY FRACTIONAL FLOW RESERVE DATA PREP, CT CORONARY FRACTIONAL FLOW RESERVE FLUID ANALYSIS  Family history of premature CAD - Father with MI at age 60 years, Sister with carotid endarterectomy at age 84 years.  Hypercholesteremia  Essential hypertension  Nonspecific abnormal electrocardiogram (ECG) (EKG) - Plan: CT CORONARY MORPH W/CTA COR W/SCORE W/CA W/CM &/OR WO/CM, CT CORONARY FRACTIONAL FLOW RESERVE DATA PREP, CT CORONARY FRACTIONAL FLOW RESERVE FLUID ANALYSIS  EKG 07/27/2018: Normal sinus rhythm at rate of 60 bpm, left atrial enlargement.  Incomplete right bundle branch block.  Normal axis.  T wave abnormality, anterolateral ischemia, inferior ischemia.  Borderline criteria for LVH.  Recommendations:   Patient with significant cardiac vascular risk factors including hypertension, hyperlipidemia and a very strong family history of premature coronary artery disease presenting with abnormal EKG and exertional chest tightness with shortness of breath.  I'm very concerned about his presentation.  Patient instructed not to do heavy lifting, heavy exertional activity, swimming until evaluation is complete.  Patient instructed to call if symptoms worse or to go to the ED for further evaluation.  I'll obtain coronary CTA. Will schedule for an echocardiogram. Otherwise is on appropriate medical therapy, I'll make recommendations based on CTA.  He is now on aspirin and low to moderate intensity statin will consider changing it once another results of the CTA.  Adrian Prows, MD, [facility] 07/27/2018, 12:42 PM [facility]. [place] Pager: [phone] Office: [phone] If no answer Cell [phone]

## 2018-07-28 ENCOUNTER — Ambulatory Visit (INDEPENDENT_AMBULATORY_CARE_PROVIDER_SITE_OTHER): Payer: 59

## 2018-07-28 ENCOUNTER — Other Ambulatory Visit: Payer: Self-pay

## 2018-07-28 DIAGNOSIS — R0789 Other chest pain: Secondary | ICD-10-CM

## 2018-07-28 DIAGNOSIS — R0602 Shortness of breath: Secondary | ICD-10-CM

## 2018-08-03 ENCOUNTER — Other Ambulatory Visit: Payer: Self-pay | Admitting: Cardiology

## 2018-08-03 DIAGNOSIS — I10 Essential (primary) hypertension: Secondary | ICD-10-CM

## 2018-08-05 LAB — BASIC METABOLIC PANEL
BUN/Creatinine Ratio: 15 (ref 10–24)
BUN: 16 mg/dL (ref 8–27)
CO2: 26 mmol/L (ref 20–29)
Calcium: 9.7 mg/dL (ref 8.6–10.2)
Chloride: 101 mmol/L (ref 96–106)
Creatinine, Ser: 1.04 mg/dL (ref 0.76–1.27)
GFR calc Af Amer: 88 mL/min/{1.73_m2} (ref >59)
GFR calc non Af Amer: 76 mL/min/{1.73_m2} (ref >59)
Glucose: 117 mg/dL — ABNORMAL HIGH (ref 65–99)
Potassium: 5 mmol/L (ref 3.5–5.2)
Sodium: 140 mmol/L (ref 134–144)

## 2018-08-06 ENCOUNTER — Telehealth (HOSPITAL_COMMUNITY): Payer: Self-pay | Admitting: Emergency Medicine

## 2018-08-06 NOTE — Telephone Encounter (Signed)
Left message on voicemail with name and callback number Cionna Collantes RN Navigator Cardiac [facility] [phone] Office [phone] Cell  

## 2018-08-08 ENCOUNTER — Ambulatory Visit (HOSPITAL_COMMUNITY): Admission: RE | Admit: 2018-08-08 | Payer: 59 | Source: Ambulatory Visit

## 2018-08-08 ENCOUNTER — Other Ambulatory Visit: Payer: Self-pay

## 2018-08-08 ENCOUNTER — Ambulatory Visit (HOSPITAL_COMMUNITY)
Admission: RE | Admit: 2018-08-08 | Discharge: 2018-08-08 | Disposition: A | Discharge status: Home / Self Care | Payer: 59 | Source: Ambulatory Visit | Attending: Cardiology | Admitting: Cardiology

## 2018-08-08 ENCOUNTER — Encounter: Payer: 59 | Admitting: *Deleted

## 2018-08-08 DIAGNOSIS — R0789 Other chest pain: Secondary | ICD-10-CM | POA: Insufficient documentation

## 2018-08-08 DIAGNOSIS — R9431 Abnormal electrocardiogram [ECG] [EKG]: Secondary | ICD-10-CM

## 2018-08-08 DIAGNOSIS — R0602 Shortness of breath: Secondary | ICD-10-CM | POA: Insufficient documentation

## 2018-08-08 DIAGNOSIS — Z006 Encounter for examination for normal comparison and control in clinical research program: Secondary | ICD-10-CM

## 2018-08-08 MED ORDER — NITROGLYCERIN 0.4 MG SL SUBL
0.8000 mg | SUBLINGUAL_TABLET | Freq: Once | SUBLINGUAL | Status: AC
  Administered 2018-08-08: 0.8 mg via SUBLINGUAL
  Filled 2018-08-08: qty 25

## 2018-08-08 MED ORDER — NITROGLYCERIN 0.4 MG SL SUBL
SUBLINGUAL_TABLET | SUBLINGUAL | Status: AC
  Filled 2018-08-08: qty 2

## 2018-08-08 MED ORDER — IOHEXOL 350 MG/ML SOLN
100.0000 mL | Freq: Once | INTRAVENOUS | Status: AC | PRN
  Administered 2018-08-08: 16:00:00 100 mL via INTRAVENOUS

## 2018-08-08 NOTE — Research (Signed)
CADFEM Informed Consent                  Subject Name:   Sean Richmond   Subject met inclusion and exclusion criteria.  The informed consent form, study requirements and expectations were reviewed with the subject and questions and concerns were addressed prior to the signing of the consent form.  The subject verbalized understanding of the trial requirements.  The subject agreed to participate in the CADFEM trial and signed the informed consent.  The informed consent was obtained prior to performance of any protocol-specific procedures for the subject.  A copy of the signed informed consent was given to the subject and a copy was placed in the subject's medical record.   [place] Chalmers, Research Assistant 08/08/2018 13:42 p.m.

## 2018-08-11 ENCOUNTER — Telehealth: Payer: Self-pay

## 2018-08-11 NOTE — Telephone Encounter (Signed)
Please advise 

## 2018-08-11 NOTE — Telephone Encounter (Signed)
Yes he may.

## 2018-08-25 ENCOUNTER — Ambulatory Visit: Payer: 59 | Admitting: Cardiology

## 2018-08-25 ENCOUNTER — Other Ambulatory Visit: Payer: Self-pay

## 2018-08-25 ENCOUNTER — Encounter: Payer: Self-pay | Admitting: Cardiology

## 2018-08-25 VITALS — BP 129/75 | HR 60 | Ht 71.0 in | Wt 198.9 lb

## 2018-08-25 DIAGNOSIS — Z8249 Family history of ischemic heart disease and other diseases of the circulatory system: Secondary | ICD-10-CM | POA: Diagnosis not present

## 2018-08-25 DIAGNOSIS — E7800 Pure hypercholesterolemia, unspecified: Secondary | ICD-10-CM | POA: Diagnosis not present

## 2018-08-25 DIAGNOSIS — R0789 Other chest pain: Secondary | ICD-10-CM | POA: Diagnosis not present

## 2018-08-25 NOTE — Progress Notes (Signed)
Primary Physician/Referring:  Leanna Battles, MD  Patient ID: Sean Richmond, male    DOB: 1955/05/22, 63 y.o.   MRN: [id number]  Chief Complaint  Patient presents with  . Chest Pain  . Follow-up    HPI: Sean Richmond  is a 63 y.o. male  with Hypertension, hyperlipidemia, strong family history of premature coronary artery disease in his father with MI at age 67 and sister with carotid endarterectomy at age 67, referred to me on a stat basis for evaluation of recent onset of dyspnea on exertion.  Patient is an avid cyclist and over the past 2 weeks he has noticed decreased exercise tolerance and marked dyspnea to point where he had to stop and had to return home.  He has occasional noticed associated chest tightness as well but no radiation.  No PND or orthopnea.  No hemoptysis or leg edema.   I had seen him 3 -4 weeks ago, he has not resumed full activity, but has not had recurrence of chest pain.    Past Medical History:  Diagnosis Date  . Hyperlipidemia   . Hypertension   . Seasonal allergies     Past Surgical History:  Procedure Laterality Date  . BACK SURGERY  50    Social History   Socioeconomic History  . Marital status: Married    Spouse name: Not on file  . Number of children: 1  . Years of education: Not on file  . Highest education level: Not on file  Occupational History  . Not on file  Social Needs  . Financial resource strain: Not on file  . Food insecurity    Worry: Not on file    Inability: Not on file  . Transportation needs    Medical: Not on file    Non-medical: Not on file  Tobacco Use  . Smoking status: Never Smoker  . Smokeless tobacco: Never Used  Substance and Sexual Activity  . Alcohol use: Yes    Comment: beer occ  . Drug use: No  . Sexual activity: Not on file  Lifestyle  . Physical activity    Days per week: Not on file    Minutes per session: Not on file  . Stress: Not on file  Relationships  . Social Clinical research associate on phone: Not on file    Gets together: Not on file    Attends religious service: Not on file    Active member of club or organization: Not on file    Attends meetings of clubs or organizations: Not on file    Relationship status: Not on file  . Intimate partner violence    Fear of current or ex partner: Not on file    Emotionally abused: Not on file    Physically abused: Not on file    Forced sexual activity: Not on file  Other Topics Concern  . Not on file  Social History Narrative  . Not on file   Review of Systems  Constitution: Negative for chills, decreased appetite, malaise/fatigue and weight gain.  Cardiovascular: Positive for dyspnea on exertion. Negative for chest pain, leg swelling and syncope.  Endocrine: Negative for cold intolerance.  Hematologic/Lymphatic: Does not bruise/bleed easily.  Musculoskeletal: Negative for joint swelling.  Gastrointestinal: Negative for abdominal pain, anorexia, change in bowel habit, hematochezia and melena.  Neurological: Negative for headaches and light-headedness.  Psychiatric/Behavioral: Negative for depression and substance abuse.  All other systems reviewed and are negative.  Objective  Blood pressure 129/75, pulse 60, height 5\' 11"  (1.803 m), weight 198 lb 14.4 oz (90.2 kg), SpO2 97 %. Body mass index is 27.74 kg/m.    Physical Exam  Constitutional: He appears well-developed and well-nourished. No distress.  HENT:  Head: Atraumatic.  Eyes: Conjunctivae are normal.  Neck: Neck supple. No JVD present. No thyromegaly present.  Cardiovascular: Normal rate, regular rhythm, normal heart sounds and intact distal pulses. Exam reveals no gallop.  No murmur heard. Pulmonary/Chest: Effort normal and breath sounds normal.  Abdominal: Soft. Bowel sounds are normal.  Musculoskeletal: Normal range of motion.  Neurological: He is alert.  Skin: Skin is warm and dry.  Psychiatric: He has a normal mood and affect.   Radiology: No  results found.  Laboratory examination:   Labs 07/22/2018: Hb 15.2/HCT 25.0, platelets 278.  CMP Latest Ref Rng & Units 08/04/2018 03/20/2013 12/12/2010  Glucose 65 - 99 mg/dL 117(H) 95 100(H)  BUN 8 - 27 mg/dL 16 17 17   Creatinine 0.76 - 1.27 mg/dL 1.04 1.0 1.3  Sodium 134 - 144 mmol/L 140 141 142  Potassium 3.5 - 5.2 mmol/L 5.0 5.2(H) 5.0  Chloride 96 - 106 mmol/L 101 105 105  CO2 20 - 29 mmol/L 26 29 29   Calcium 8.6 - 10.2 mg/dL 9.7 9.1 9.6  Total Protein 6.0 - 8.3 g/dL - 6.4 6.8  Total Bilirubin 0.3 - 1.2 mg/dL - 0.6 0.7  Alkaline Phos 39 - 117 U/L - 39 39  AST 0 - 37 U/L - 26 24  ALT 0 - 53 U/L - 32 39   CBC Latest Ref Rng & Units 03/20/2013 12/12/2010 06/12/2009  WBC 4.5 - 10.5 K/uL 5.7 5.4 5.4  Hemoglobin 13.0 - 17.0 g/dL 14.6 15.2 14.8  Hematocrit 39.0 - 52.0 % 42.8 43.7 42.0  Platelets 150.0 - 400.0 K/uL 282.0 289.0 272.0   Lipid Panel     Component Value Date/Time   CHOL 254 (H) 06/23/2013 1010   TRIG 178.0 (H) 06/23/2013 1010   HDL 44.10 06/23/2013 1010   CHOLHDL 6 06/23/2013 1010   VLDL 35.6 06/23/2013 1010   LDLCALC 174 (H) 06/23/2013 1010   LDLDIRECT 150.9 03/20/2013 0739   HEMOGLOBIN A1C No results found for: HGBA1C, MPG TSH No results for input(s): TSH in the last 8760 hours.  PRN Meds:. There are no discontinued medications. Current Meds  Medication Sig  . amLODipine (NORVASC) 5 MG tablet daily.  Marland Kitchen aspirin EC 81 MG tablet Take 81 mg by mouth daily.  Marland Kitchen loratadine (CLARITIN) 10 MG tablet Take 10 mg by mouth as needed.   . pravastatin (PRAVACHOL) 40 MG tablet at bedtime.  Marland Kitchen telmisartan (MICARDIS) 40 MG tablet daily.    Cardiac Studies:   Echocardiogram 07/28/2018 :  Normal LV systolic function with EF 62%. Left ventricle cavity is normal in size. Mild concentric hypertrophy of the left ventricle. Normal global wall motion. Normal diastolic filling pattern. Calculated EF 62%. Left atrial cavity is mildly dilated at 4.3 cm. Normally apearing mitral  valve. Mild (Grade I) mitral regurgitation. Structurally normal tricuspid valve. Mild tricuspid regurgitation. No evidence of pulmonary hypertension. Insignificant posterior pericardial effusion.  Cardiac CTA 08/08/2018:  Coronary calcium score 6.0.  Minimal plaque in the RCA, otherwise normal coronary arteries.  Mild aortic atherosclerosis.  Alert noncardiac structures are normal.  Normal aortic root size.  Assessment   1. Chest tightness   2. Family history of premature CAD   3. Hypercholesteremia    EKG 07/27/2018: Normal  sinus rhythm at rate of 60 bpm, left atrial enlargement.  Incomplete right bundle branch block.  Normal axis.  T wave abnormality, anterolateral ischemia, inferior ischemia.  Borderline criteria for LVH.  Recommendations:   Patient with significant cardiac vascular risk factors including hypertension, hyperlipidemia and a very strong family history of premature coronary artery disease presenting with abnormal EKG and exertional chest tightness with shortness of breath.   After review of the echocardiogram and the coronary CTA, I have reassured the patient and his dyspnea may be related to age-related deconditioning and advised him to continue to push himself knowing that his coronary anatomy is essentially normal.  He does have mild coronary calcium.  Would recommend switching from pravastatin to Crestor 20 mg daily.  At least in any 15, he had markedly elevated LDL along with elevated triglycerides.  This will offer him the most benefit.  Previously he had tried atorvastatin and caused him to have mild myalgias.  He can follow-up with his PCP Dr. Janie Morning for further management and follow-up.  Patient was pleased with the evaluation.  He has not had any recurrence of chest pain since last office visit.  Advised him to increase his physical activity and to certainly call me back if he notices worsening dyspnea or chest pain with exertion. Adrian Prows, MD, [facility] 08/25/2018,  9:40 AM [facility]. [place] Pager: [phone] Office: [phone] If no answer Cell [phone]

## 2019-12-14 IMAGING — CT CT HEAR MORPH WITH CTA COR WITH SCORE WITH CA WITH CONTRAST AND
4 of 7 series · 8 of 20 positions shown, 9 images · IV contrast (APPLIED)
Comparison: None.

Addendum:
CLINICAL DATA: Chest pain

EXAM:
Cardiac CTA
MEDICATIONS:
Sub lingual nitro. 4mg and lopressor 5mg
TECHNIQUE: The patient was scanned on a Siemens Force [REDACTED]ice scanner. Gantry
rotation speed was 250 msecs. Collimation was .6 mm. A 100 kV
prospective scan was triggered in the ascending thoracic aorta at
140 HU's Full mA was used between 35% and 75% of the R-R interval.
Average HR during the scan was 68 bpm. The 3D data set was
interpreted on a dedicated work station using MPR, MIP and VRT
modes. A total of 80 cc of contrast was used.

[Series 6: best diast 75 % · axial · 0.39mm/px · z∈[+1175,+1218]mm · 2 of 323 slices shown, 3 images]
[im 108/323  vessel]
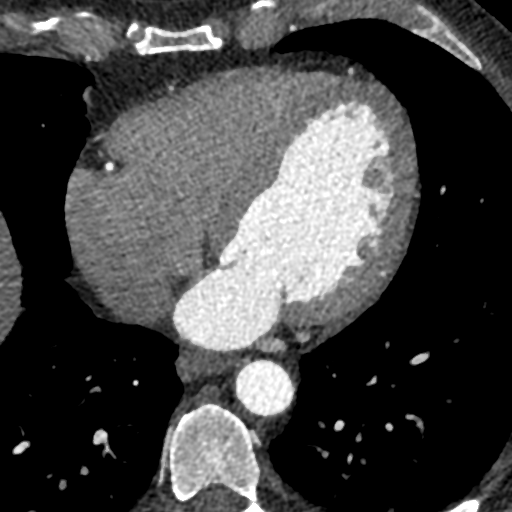
[im 108/323  lung]
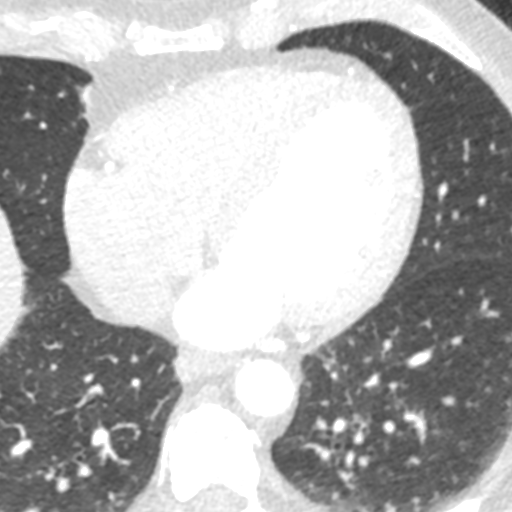
[im 215/323  vessel]
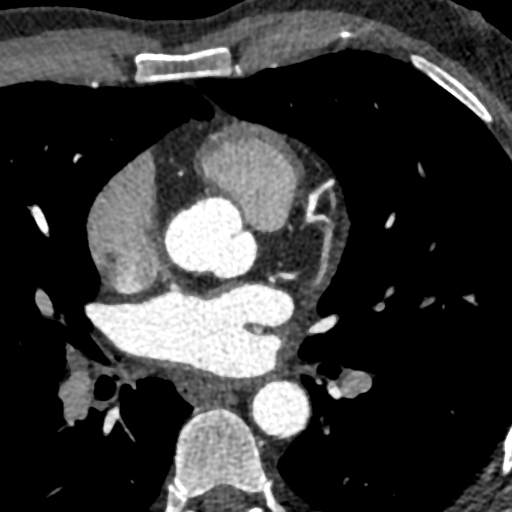

[Series 7: best syst 35 % · axial · 0.39mm/px · z∈[+1175,+1218]mm · 2 of 323 slices shown]
[im 108/323  vessel]
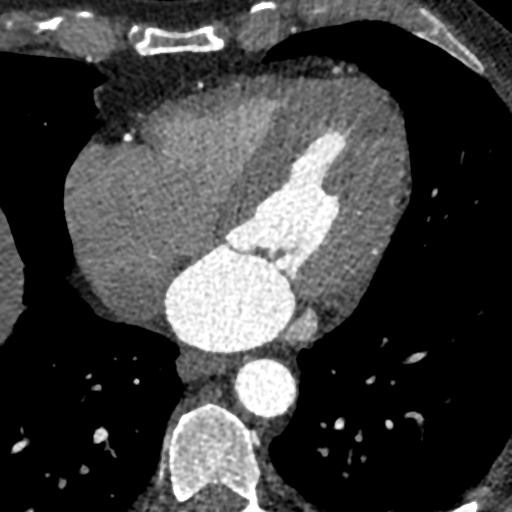
[im 215/323  vessel]
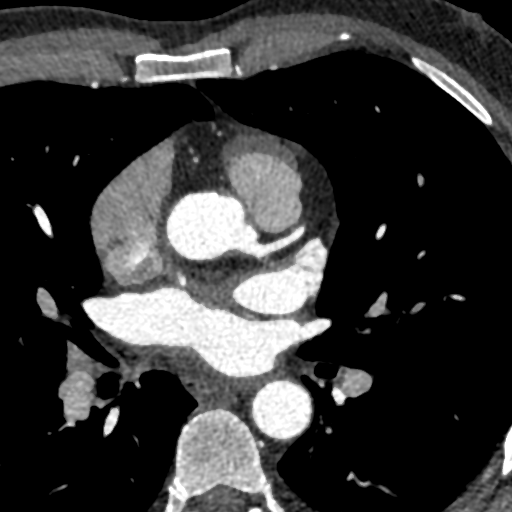

[Series 8: ts diast sharp 75 % · axial · 0.39mm/px · z∈[+1175,+1218]mm · 2 of 323 slices shown]
[im 108/323  lung]
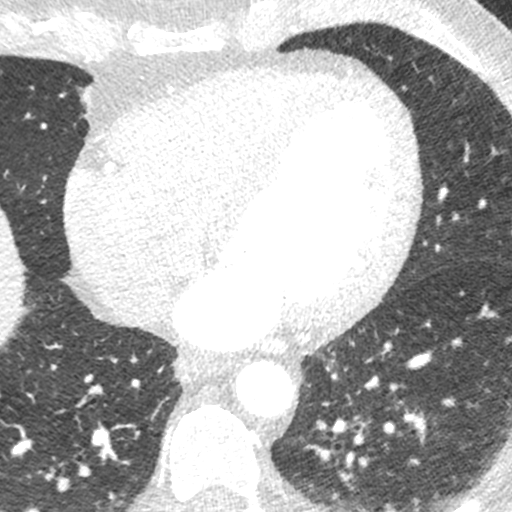
[im 215/323  lung]
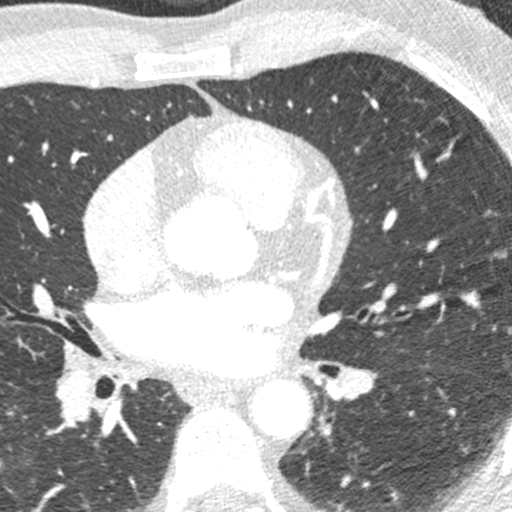

[Series 9: ts syst sharp 35 % · axial · 0.39mm/px · z∈[+1175,+1218]mm · 2 of 323 slices shown]
[im 108/323  lung]
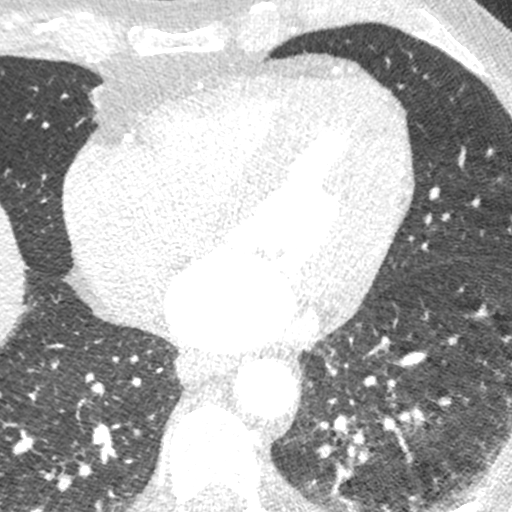
[im 215/323  lung]
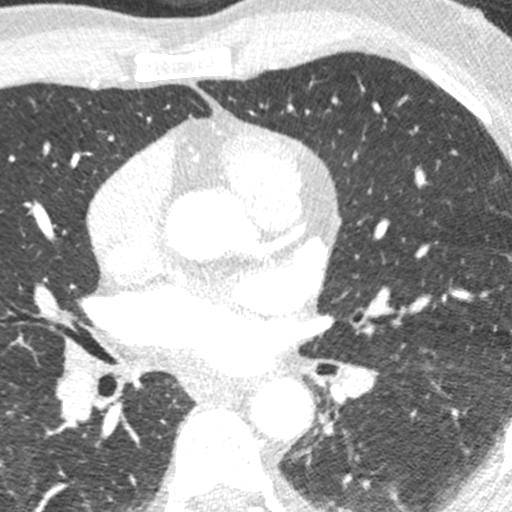

[8 of 20 positions shown; findings below may reference images not displayed]

FINDINGS: Non-cardiac: See separate report from [REDACTED]. No
significant findings on limited lung and soft tissue windows.

Calcium Score: Minimal calcium noted in distal RCA and PLA

Coronary Arteries: Right dominant with no anomalies

LM: Normal

LAD: Normal

D1: Normal

D2: Normal

Circumflex: Normal

OM1: Normal

AV Groove Normal

RCA: 1-24% calcific plaque in distal RCA and PLA

PDA: Normal

PLA: 1-24% calcific plaque
IMPRESSION: 1. Calcium score 6 isolated to distal RCA and PLB This is only 29 th
percentile for age and sex

2.  Mild non obstructive CAD RADS 1 CAD involving distal RCA and PLB

3.  Normal aortic root 3.1 cm

Nicholo Mayr

EXAM:
OVER-READ INTERPRETATION  CT CHEST

The following report is an over-read performed by radiologist Dr.
over-read does not include interpretation of cardiac or coronary
anatomy or pathology. The coronary calcium score and cardiac CTA
interpretation by the cardiologist is attached.
FINDINGS: Aortic atherosclerosis. Within the visualized portions of the thorax
there are no suspicious appearing pulmonary nodules or masses, there
is no acute consolidative airspace disease, no pleural effusions, no
pneumothorax and no lymphadenopathy. Visualized portions of the
upper abdomen are unremarkable. There are no aggressive appearing
lytic or blastic lesions noted in the visualized portions of the
skeleton.
IMPRESSION: 1.  Aortic Atherosclerosis ([id number]).

*** End of Addendum ***
FINDINGS: Non-cardiac: See separate report from [REDACTED]. No
significant findings on limited lung and soft tissue windows.

Calcium Score: Minimal calcium noted in distal RCA and PLA

Coronary Arteries: Right dominant with no anomalies

LM: Normal

LAD: Normal

D1: Normal

D2: Normal

Circumflex: Normal

OM1: Normal

AV Groove Normal

RCA: 1-24% calcific plaque in distal RCA and PLA

PDA: Normal

PLA: 1-24% calcific plaque
IMPRESSION: 1. Calcium score 6 isolated to distal RCA and PLB This is only 29 th
percentile for age and sex

2.  Mild non obstructive CAD RADS 1 CAD involving distal RCA and PLB

3.  Normal aortic root 3.1 cm

Nicholo Mayr

## 2020-07-04 DIAGNOSIS — Z125 Encounter for screening for malignant neoplasm of prostate: Secondary | ICD-10-CM | POA: Diagnosis not present

## 2020-07-04 DIAGNOSIS — E785 Hyperlipidemia, unspecified: Secondary | ICD-10-CM | POA: Diagnosis not present

## 2020-07-09 DIAGNOSIS — K921 Melena: Secondary | ICD-10-CM | POA: Diagnosis not present

## 2020-07-09 DIAGNOSIS — R82998 Other abnormal findings in urine: Secondary | ICD-10-CM | POA: Diagnosis not present

## 2020-07-09 DIAGNOSIS — I10 Essential (primary) hypertension: Secondary | ICD-10-CM | POA: Diagnosis not present

## 2020-07-26 ENCOUNTER — Encounter: Payer: Self-pay | Admitting: Internal Medicine

## 2020-10-14 ENCOUNTER — Other Ambulatory Visit: Payer: Self-pay

## 2020-10-14 ENCOUNTER — Encounter: Payer: Self-pay | Admitting: Internal Medicine

## 2020-10-14 ENCOUNTER — Ambulatory Visit (AMBULATORY_SURGERY_CENTER): Payer: Self-pay | Admitting: *Deleted

## 2020-10-14 VITALS — Ht 71.0 in | Wt 204.0 lb

## 2020-10-14 DIAGNOSIS — R195 Other fecal abnormalities: Secondary | ICD-10-CM

## 2020-10-14 MED ORDER — SUTAB 1479-225-188 MG PO TABS: 24.0000 | ORAL_TABLET | ORAL | 0 refills | Status: DC

## 2020-10-14 NOTE — Progress Notes (Signed)
No egg or soy allergy known to patient  No issues with past sedation with any surgeries or procedures Patient denies ever being told they had issues or difficulty with intubation  No FH of Malignant Hyperthermia No diet pills per patient No home 02 use per patient  No blood thinners per patient  Pt denies issues with constipation  No A fib or A flutter  EMMI video to pt or via [place] 19 guidelines implemented in [place] today with Pt and RN   Pt is fully vaccinated  for Covid   SUTAB  Coupon given to pt in [facility] today , Code to Pharmacy and  NO PA's for preps discussed with pt In [facility] today  Discussed with pt there will be an out-of-pocket cost for prep and that varies from $0 to 70 +  dollars   Due to the COVID-19 pandemic we are asking patients to follow certain guidelines.  Pt aware of COVID protocols and [facility] guidelines

## 2020-10-28 ENCOUNTER — Other Ambulatory Visit: Payer: Self-pay

## 2020-10-28 ENCOUNTER — Ambulatory Visit (AMBULATORY_SURGERY_CENTER): Payer: BC Managed Care – PPO | Admitting: Internal Medicine

## 2020-10-28 ENCOUNTER — Encounter: Payer: Self-pay | Admitting: Internal Medicine

## 2020-10-28 VITALS — BP 139/72 | HR 55 | Temp 97.3°F | Resp 18 | Ht 71.0 in | Wt 204.0 lb

## 2020-10-28 DIAGNOSIS — R195 Other fecal abnormalities: Secondary | ICD-10-CM | POA: Diagnosis not present

## 2020-10-28 DIAGNOSIS — D125 Benign neoplasm of sigmoid colon: Secondary | ICD-10-CM | POA: Diagnosis not present

## 2020-10-28 DIAGNOSIS — K648 Other hemorrhoids: Secondary | ICD-10-CM | POA: Diagnosis not present

## 2020-10-28 MED ORDER — SODIUM CHLORIDE 0.9 % IV SOLN: 500.0000 mL | Freq: Once | INTRAVENOUS | Status: DC

## 2020-10-28 NOTE — Op Note (Signed)
[facility] Patient Name: Sean Richmond Procedure Date: 10/28/2020 9:15 AM MRN: [id number] Endoscopist: Jerene Bears , MD Age: 65 Referring MD:  Date of Birth: [birth year] Gender: Male Account #: [id number] Procedure:                Colonoscopy Indications:              Last colonoscopy: 2007, Positive fecal                            immunochemical test Medicines:                Monitored Anesthesia Care Procedure:                Pre-Anesthesia Assessment:                           - Prior to the procedure, a History and Physical                            was performed, and patient medications and                            allergies were reviewed. The patient's tolerance of                            previous anesthesia was also reviewed. The risks                            and benefits of the procedure and the sedation                            options and risks were discussed with the patient.                            All questions were answered, and informed consent                            was obtained. Prior Anticoagulants: The patient has                            taken no previous anticoagulant or antiplatelet                            agents. ASA Grade Assessment: II - A patient with                            mild systemic disease. After reviewing the risks                            and benefits, the patient was deemed in                            satisfactory condition to undergo the procedure.  After obtaining informed consent, the colonoscope                            was passed under direct vision. Throughout the                            procedure, the patient's blood pressure, pulse, and                            oxygen saturations were monitored continuously. The                            CF [id number] was introduced through the anus                            and advanced to the terminal ileum. The colonoscopy                             was performed without difficulty. The patient                            tolerated the procedure well. The quality of the                            bowel preparation was good. The terminal ileum,                            ileocecal valve, appendiceal orifice, and rectum                            were photographed. Scope In: 9:19:44 AM Scope Out: 9:38:10 AM Scope Withdrawal Time: 0 hours 15 minutes 59 seconds  Total Procedure Duration: 0 hours 18 minutes 26 seconds  Findings:                 The digital rectal exam was normal.                           The terminal ileum appeared normal.                           Two sessile polyps were found in the sigmoid colon.                            The polyps were 3 to 4 mm in size. These polyps                            were removed with a cold snare. Resection and                            retrieval were complete.                           Internal hemorrhoids were found during  retroflexion. The hemorrhoids were small.                           The exam was otherwise without abnormality. Complications:            No immediate complications. Estimated Blood Loss:     Estimated blood loss was minimal. Impression:               - The examined portion of the ileum was normal.                           - Two 3 to 4 mm polyps in the sigmoid colon,                            removed with a cold snare. Resected and retrieved.                           - Small internal hemorrhoids.                           - The examination was otherwise normal. Recommendation:           - Patient has a contact number available for                            emergencies. The signs and symptoms of potential                            delayed complications were discussed with the                            patient. Return to normal activities tomorrow.                            Written discharge instructions were  provided to the                            patient.                           - Resume previous diet.                           - Continue present medications.                           - Await pathology results.                           - Repeat colonoscopy is recommended. The                            colonoscopy date will be determined after pathology                            results from today's exam become available for  review. Jerene Bears, MD 10/28/2020 9:40:35 AM This report has been signed electronically.

## 2020-10-28 NOTE — Progress Notes (Signed)
VS completed by CW.   Pt's states no medical or surgical changes since previsit or office visit.  

## 2020-10-28 NOTE — Patient Instructions (Signed)
Handouts were given to your care partner on polyps and hemorrhoids. You may resume your current medications today. Await biopsy results.  May take 1-3 weeks to receive pathology results. Please call if any questions or concerns.     YOU HAD AN ENDOSCOPIC PROCEDURE TODAY AT [facility]:   Refer to the procedure report that was given to you for any specific questions about what was found during the examination.  If the procedure report does not answer your questions, please call your gastroenterologist to clarify.  If you requested that your care partner not be given the details of your procedure findings, then the procedure report has been included in a sealed envelope for you to review at your convenience later.  YOU SHOULD EXPECT: Some feelings of bloating in the abdomen. Passage of more gas than usual.  Walking can help get rid of the air that was put into your GI tract during the procedure and reduce the bloating. If you had a lower endoscopy (such as a colonoscopy or flexible sigmoidoscopy) you may notice spotting of blood in your stool or on the toilet paper. If you underwent a bowel prep for your procedure, you may not have a normal bowel movement for a few days.  Please Note:  You might notice some irritation and congestion in your nose or some drainage.  This is from the oxygen used during your procedure.  There is no need for concern and it should clear up in a day or so.  SYMPTOMS TO REPORT IMMEDIATELY:  Following lower endoscopy (colonoscopy or flexible sigmoidoscopy):  Excessive amounts of blood in the stool  Significant tenderness or worsening of abdominal pains  Swelling of the abdomen that is new, acute  Fever of 100F or higher    For urgent or emergent issues, a gastroenterologist can be reached at any hour by calling [phone]. Do not use MyChart messaging for urgent concerns.    DIET:  We do recommend a small meal at first, but then you may proceed  to your regular diet.  Drink plenty of fluids but you should avoid alcoholic beverages for 24 hours.  ACTIVITY:  You should plan to take it easy for the rest of today and you should NOT DRIVE or use heavy machinery until tomorrow (because of the sedation medicines used during the test).    FOLLOW UP: Our staff will call the number listed on your records 48-72 hours following your procedure to check on you and address any questions or concerns that you may have regarding the information given to you following your procedure. If we do not reach you, we will leave a message.  We will attempt to reach you two times.  During this call, we will ask if you have developed any symptoms of COVID 19. If you develop any symptoms (ie: fever, flu-like symptoms, shortness of breath, cough etc.) before then, please call [phone].  If you test positive for Covid 19 in the 2 weeks post procedure, please call and report this information to [place].    If any biopsies were taken you will be contacted by phone or by letter within the next 1-3 weeks.  Please call us at [phone] if you have not heard about the biopsies in 3 weeks.    SIGNATURES/CONFIDENTIALITY: You and/or your care partner have signed paperwork which will be entered into your electronic medical record.  These signatures attest to the fact that that the information above on your After Visit  Summary has been reviewed and is understood.  Full responsibility of the confidentiality of this discharge information lies with you and/or your care-partner.  

## 2020-10-28 NOTE — Progress Notes (Signed)
No problems noted in the recovery room. maw 

## 2020-10-28 NOTE — Progress Notes (Signed)
GASTROENTEROLOGY PROCEDURE H&P NOTE   Primary Care Physician: Leanna Battles, MD    Reason for Procedure:  Positive fit test  Plan:    Colonoscopy  Patient is appropriate for endoscopic procedure(s) in the ambulatory ([facility]) setting.  The nature of the procedure, as well as the risks, benefits, and alternatives were carefully and thoroughly reviewed with the patient. Ample time for discussion and questions allowed. The patient understood, was satisfied, and agreed to proceed.     HPI: Sean Richmond is a 65 y.o. male who presents for colonoscopy to evaluate positive fit test by primary care.  History as below.  Patient had colonoscopy in 2007 by Dr. Sharlett Iles with removal of a rectal hyperplastic polyp.  Medical history as below.  No complaints today including recent chest pain, shortness of breath or abdominal pain.  Patient tolerated the prep without problems.  Past Medical History:  Diagnosis Date   Allergy    Hyperlipidemia    Hypertension    Seasonal allergies     Past Surgical History:  Procedure Laterality Date   BACK SURGERY  50   COLONOSCOPY  2007   [facility]   POLYPECTOMY  2007   [facility] X 2    Prior to Admission medications   Medication Sig Start Date End Date Taking? Authorizing Provider  amLODipine (NORVASC) 5 MG tablet daily. 07/13/18  Yes [provider]  ezetimibe (ZETIA) 10 MG tablet Take 10 mg by mouth daily. 07/12/20  Yes [provider]  rosuvastatin (CRESTOR) 20 MG tablet Take 20 mg by mouth daily. 07/20/20  Yes [provider]  sildenafil (VIAGRA) 50 MG tablet Take 50 mg by mouth daily as needed. 07/20/20  Yes [provider]  telmisartan (MICARDIS) 40 MG tablet daily. 07/08/18  Yes [provider]  loratadine (CLARITIN) 10 MG tablet Take 10 mg by mouth as needed.     [provider]    Current Outpatient Medications  Medication Sig Dispense Refill   amLODipine (NORVASC) 5 MG tablet daily.      ezetimibe (ZETIA) 10 MG tablet Take 10 mg by mouth daily.     rosuvastatin (CRESTOR) 20 MG tablet Take 20 mg by mouth daily.     sildenafil (VIAGRA) 50 MG tablet Take 50 mg by mouth daily as needed.     telmisartan (MICARDIS) 40 MG tablet daily.     loratadine (CLARITIN) 10 MG tablet Take 10 mg by mouth as needed.      Current Facility-Administered Medications  Medication Dose Route Frequency Provider Last Rate Last Admin   0.9 %  sodium chloride infusion  500 mL Intravenous Once Lygia Olaes, Lajuan Lines, MD        Allergies as of 10/28/2020 - Review Complete 10/28/2020  Allergen Reaction Noted   Lipitor [atorvastatin calcium] Other (See Comments) 07/27/2018    Family History  Problem Relation Age of Onset   Heart attack Father    Stroke Father    Heart disease Sister    Cancer Brother    Colon cancer Neg Hx    Colon polyps Neg Hx    Esophageal cancer Neg Hx    Stomach cancer Neg Hx    Rectal cancer Neg Hx     Social History   Socioeconomic History   Marital status: Married    Spouse name: Not on file   Number of children: 1   Years of education: Not on file   Highest education level: Not on file  Occupational History  Not on file  Tobacco Use   Smoking status: Never   Smokeless tobacco: Never  Vaping Use   Vaping Use: Never used  Substance and Sexual Activity   Alcohol use: Yes    Comment: beer occ   Drug use: No   Sexual activity: Not on file  Other Topics Concern   Not on file  Social History Narrative   Not on file   Social Determinants of Health   Financial Resource Strain: Not on file  Food Insecurity: Not on file  Transportation Needs: Not on file  Physical Activity: Not on file  Stress: Not on file  Social Connections: Not on file  Intimate Partner Violence: Not on file    Physical Exam: Vital signs in last 24 hours: '@BP'$  137/65   Pulse 61   Temp (!) 97.3 F (36.3 C) (Temporal)   Ht '5\' 11"'$  (1.803 m)   Wt 204 lb (92.5 kg)   SpO2 96%   BMI 28.45  kg/m  GEN: NAD EYE: Sclerae anicteric ENT: MMM CV: Non-tachycardic Pulm: CTA b/l GI: Soft, NT/ND NEURO:  Alert & Oriented x 3   Zenovia Jarred, MD [facility]  10/28/2020 9:10 AM

## 2020-10-28 NOTE — Progress Notes (Signed)
Called to room to assist during endoscopic procedure.  Patient ID and intended procedure confirmed with present staff. Received instructions for my participation in the procedure from the performing physician.  

## 2020-10-28 NOTE — Progress Notes (Signed)
Report to PACU, RN, vss, BBS= Clear.  

## 2020-10-30 ENCOUNTER — Telehealth: Payer: Self-pay | Admitting: *Deleted

## 2020-10-30 ENCOUNTER — Telehealth: Payer: Self-pay

## 2020-10-30 NOTE — Telephone Encounter (Signed)
Left message on follow up call. 

## 2020-10-30 NOTE — Telephone Encounter (Signed)
Attempted to complete the post-procedure telephone call. Patient did not answer. Left voicemail for them to call back if they had any questions or concerns.

## 2020-10-31 ENCOUNTER — Encounter: Payer: Self-pay | Admitting: Internal Medicine

## 2021-09-08 DIAGNOSIS — Z125 Encounter for screening for malignant neoplasm of prostate: Secondary | ICD-10-CM | POA: Diagnosis not present

## 2021-09-08 DIAGNOSIS — Z0000 Encounter for general adult medical examination without abnormal findings: Secondary | ICD-10-CM | POA: Diagnosis not present

## 2021-09-08 DIAGNOSIS — E785 Hyperlipidemia, unspecified: Secondary | ICD-10-CM | POA: Diagnosis not present

## 2021-09-08 DIAGNOSIS — R7301 Impaired fasting glucose: Secondary | ICD-10-CM | POA: Diagnosis not present

## 2021-09-15 DIAGNOSIS — Z1339 Encounter for screening examination for other mental health and behavioral disorders: Secondary | ICD-10-CM | POA: Diagnosis not present

## 2021-09-15 DIAGNOSIS — Z0000 Encounter for general adult medical examination without abnormal findings: Secondary | ICD-10-CM | POA: Diagnosis not present

## 2021-09-15 DIAGNOSIS — N529 Male erectile dysfunction, unspecified: Secondary | ICD-10-CM | POA: Diagnosis not present

## 2021-09-15 DIAGNOSIS — R82998 Other abnormal findings in urine: Secondary | ICD-10-CM | POA: Diagnosis not present

## 2021-09-15 DIAGNOSIS — I10 Essential (primary) hypertension: Secondary | ICD-10-CM | POA: Diagnosis not present

## 2021-09-15 DIAGNOSIS — Z1331 Encounter for screening for depression: Secondary | ICD-10-CM | POA: Diagnosis not present

## 2022-03-16 DIAGNOSIS — Z23 Encounter for immunization: Secondary | ICD-10-CM | POA: Diagnosis not present

## 2022-03-16 DIAGNOSIS — I10 Essential (primary) hypertension: Secondary | ICD-10-CM | POA: Diagnosis not present

## 2022-03-16 DIAGNOSIS — E119 Type 2 diabetes mellitus without complications: Secondary | ICD-10-CM | POA: Diagnosis not present

## 2022-08-31 DIAGNOSIS — H40051 Ocular hypertension, right eye: Secondary | ICD-10-CM | POA: Diagnosis not present

## 2022-08-31 DIAGNOSIS — H43811 Vitreous degeneration, right eye: Secondary | ICD-10-CM | POA: Diagnosis not present

## 2022-08-31 DIAGNOSIS — H52223 Regular astigmatism, bilateral: Secondary | ICD-10-CM | POA: Diagnosis not present

## 2022-08-31 DIAGNOSIS — H524 Presbyopia: Secondary | ICD-10-CM | POA: Diagnosis not present

## 2022-08-31 DIAGNOSIS — H18053 Posterior corneal pigmentations, bilateral: Secondary | ICD-10-CM | POA: Diagnosis not present

## 2022-08-31 DIAGNOSIS — H35371 Puckering of macula, right eye: Secondary | ICD-10-CM | POA: Diagnosis not present

## 2022-11-25 DIAGNOSIS — E119 Type 2 diabetes mellitus without complications: Secondary | ICD-10-CM | POA: Diagnosis not present

## 2022-11-25 DIAGNOSIS — E785 Hyperlipidemia, unspecified: Secondary | ICD-10-CM | POA: Diagnosis not present

## 2022-11-25 DIAGNOSIS — Z1212 Encounter for screening for malignant neoplasm of rectum: Secondary | ICD-10-CM | POA: Diagnosis not present

## 2022-11-25 DIAGNOSIS — I10 Essential (primary) hypertension: Secondary | ICD-10-CM | POA: Diagnosis not present

## 2022-11-27 DIAGNOSIS — H40051 Ocular hypertension, right eye: Secondary | ICD-10-CM | POA: Diagnosis not present

## 2022-11-30 DIAGNOSIS — H40051 Ocular hypertension, right eye: Secondary | ICD-10-CM | POA: Diagnosis not present

## 2022-12-03 DIAGNOSIS — I10 Essential (primary) hypertension: Secondary | ICD-10-CM | POA: Diagnosis not present

## 2022-12-03 DIAGNOSIS — Z23 Encounter for immunization: Secondary | ICD-10-CM | POA: Diagnosis not present

## 2022-12-03 DIAGNOSIS — Z1331 Encounter for screening for depression: Secondary | ICD-10-CM | POA: Diagnosis not present

## 2022-12-03 DIAGNOSIS — Z1339 Encounter for screening examination for other mental health and behavioral disorders: Secondary | ICD-10-CM | POA: Diagnosis not present

## 2022-12-03 DIAGNOSIS — R82998 Other abnormal findings in urine: Secondary | ICD-10-CM | POA: Diagnosis not present

## 2022-12-03 DIAGNOSIS — E119 Type 2 diabetes mellitus without complications: Secondary | ICD-10-CM | POA: Diagnosis not present

## 2022-12-03 DIAGNOSIS — Z0000 Encounter for general adult medical examination without abnormal findings: Secondary | ICD-10-CM | POA: Diagnosis not present

## 2023-03-26 DIAGNOSIS — L720 Epidermal cyst: Secondary | ICD-10-CM | POA: Diagnosis not present

## 2023-06-01 DIAGNOSIS — I10 Essential (primary) hypertension: Secondary | ICD-10-CM | POA: Diagnosis not present

## 2023-06-01 DIAGNOSIS — E119 Type 2 diabetes mellitus without complications: Secondary | ICD-10-CM | POA: Diagnosis not present

## 2023-07-27 DIAGNOSIS — H40053 Ocular hypertension, bilateral: Secondary | ICD-10-CM | POA: Diagnosis not present

## 2023-10-19 DIAGNOSIS — H43811 Vitreous degeneration, right eye: Secondary | ICD-10-CM | POA: Diagnosis not present

## 2023-10-19 DIAGNOSIS — H0288B Meibomian gland dysfunction left eye, upper and lower eyelids: Secondary | ICD-10-CM | POA: Diagnosis not present

## 2023-10-19 DIAGNOSIS — H0288A Meibomian gland dysfunction right eye, upper and lower eyelids: Secondary | ICD-10-CM | POA: Diagnosis not present

## 2023-10-19 DIAGNOSIS — H40053 Ocular hypertension, bilateral: Secondary | ICD-10-CM | POA: Diagnosis not present

## 2023-12-09 DIAGNOSIS — E119 Type 2 diabetes mellitus without complications: Secondary | ICD-10-CM | POA: Diagnosis not present

## 2023-12-09 DIAGNOSIS — E785 Hyperlipidemia, unspecified: Secondary | ICD-10-CM | POA: Diagnosis not present

## 2023-12-09 DIAGNOSIS — Z125 Encounter for screening for malignant neoplasm of prostate: Secondary | ICD-10-CM | POA: Diagnosis not present

## 2023-12-16 DIAGNOSIS — I10 Essential (primary) hypertension: Secondary | ICD-10-CM | POA: Diagnosis not present

## 2023-12-16 DIAGNOSIS — Z23 Encounter for immunization: Secondary | ICD-10-CM | POA: Diagnosis not present

## 2023-12-16 DIAGNOSIS — E119 Type 2 diabetes mellitus without complications: Secondary | ICD-10-CM | POA: Diagnosis not present

## 2023-12-16 DIAGNOSIS — Z1331 Encounter for screening for depression: Secondary | ICD-10-CM | POA: Diagnosis not present

## 2023-12-16 DIAGNOSIS — Z1339 Encounter for screening examination for other mental health and behavioral disorders: Secondary | ICD-10-CM | POA: Diagnosis not present

## 2023-12-16 DIAGNOSIS — Z0000 Encounter for general adult medical examination without abnormal findings: Secondary | ICD-10-CM | POA: Diagnosis not present

## 2023-12-16 DIAGNOSIS — R82998 Other abnormal findings in urine: Secondary | ICD-10-CM | POA: Diagnosis not present
